# Patient Record
Sex: Male | Born: 1980 | Race: Black or African American | Hispanic: No | Marital: Married | Smoking: Never smoker
Health system: Southern US, Community
[De-identification: ages and names within clinical notes are randomized; demographics above are authoritative.]

## PROBLEM LIST (undated history)

## (undated) DIAGNOSIS — M5126 Other intervertebral disc displacement, lumbar region: Secondary | ICD-10-CM

## (undated) DIAGNOSIS — I10 Essential (primary) hypertension: Secondary | ICD-10-CM

## (undated) HISTORY — DX: Essential (primary) hypertension: I10

## (undated) HISTORY — PX: SHOULDER SURGERY: SHX246

## (undated) HISTORY — DX: Other intervertebral disc displacement, lumbar region: M51.26

## (undated) HISTORY — PX: ANKLE SURGERY: SHX546

---

## 2013-09-05 ENCOUNTER — Encounter (HOSPITAL_COMMUNITY): Payer: Self-pay | Admitting: Emergency Medicine

## 2013-09-05 ENCOUNTER — Emergency Department (HOSPITAL_COMMUNITY)
Admission: EM | Admit: 2013-09-05 | Discharge: 2013-09-05 | Discharge status: Against Medical Advice | Payer: BC Managed Care – PPO | Attending: Emergency Medicine | Admitting: Emergency Medicine

## 2013-09-05 DIAGNOSIS — M549 Dorsalgia, unspecified: Secondary | ICD-10-CM | POA: Diagnosis present

## 2013-09-05 NOTE — ED Notes (Signed)
Pt states his wife got him an appointment with PCP at 2pm. Pt left without being seen.

## 2013-09-05 NOTE — ED Notes (Addendum)
Hurt back 6 weeks ago- at gym, has been seeing chiropractor but pain got bad today; denies incontinence

## 2013-09-09 ENCOUNTER — Other Ambulatory Visit: Payer: Self-pay | Admitting: Sports Medicine

## 2013-09-09 DIAGNOSIS — M48061 Spinal stenosis, lumbar region without neurogenic claudication: Secondary | ICD-10-CM

## 2013-09-10 ENCOUNTER — Ambulatory Visit
Admission: RE | Admit: 2013-09-10 | Discharge: 2013-09-10 | Disposition: A | Discharge status: Home / Self Care | Payer: PRIVATE HEALTH INSURANCE | Source: Ambulatory Visit | Attending: Sports Medicine | Admitting: Sports Medicine

## 2013-09-10 VITALS — BP 159/96 | HR 86

## 2013-09-10 DIAGNOSIS — M48061 Spinal stenosis, lumbar region without neurogenic claudication: Secondary | ICD-10-CM

## 2013-09-10 MED ORDER — IOHEXOL 180 MG/ML  SOLN
1.0000 mL | Freq: Once | INTRAMUSCULAR | Status: AC | PRN
  Administered 2013-09-10: 1 mL via EPIDURAL

## 2013-09-10 MED ORDER — METHYLPREDNISOLONE ACETATE 40 MG/ML INJ SUSP (RADIOLOG
120.0000 mg | Freq: Once | INTRAMUSCULAR | Status: AC
  Administered 2013-09-10: 120 mg via EPIDURAL

## 2013-09-10 NOTE — Discharge Instructions (Signed)

## 2013-09-16 ENCOUNTER — Other Ambulatory Visit: Payer: Self-pay | Admitting: Sports Medicine

## 2013-09-16 DIAGNOSIS — M48061 Spinal stenosis, lumbar region without neurogenic claudication: Secondary | ICD-10-CM

## 2013-09-24 ENCOUNTER — Ambulatory Visit
Admission: RE | Admit: 2013-09-24 | Discharge: 2013-09-24 | Disposition: A | Discharge status: Home / Self Care | Payer: PRIVATE HEALTH INSURANCE | Source: Ambulatory Visit | Attending: Sports Medicine | Admitting: Sports Medicine

## 2013-09-24 VITALS — BP 176/108 | HR 91

## 2013-09-24 DIAGNOSIS — M48061 Spinal stenosis, lumbar region without neurogenic claudication: Secondary | ICD-10-CM

## 2013-09-24 MED ORDER — METHYLPREDNISOLONE ACETATE 40 MG/ML INJ SUSP (RADIOLOG
120.0000 mg | Freq: Once | INTRAMUSCULAR | Status: AC
  Administered 2013-09-24: 120 mg via EPIDURAL

## 2013-09-24 MED ORDER — IOHEXOL 180 MG/ML  SOLN
1.0000 mL | Freq: Once | INTRAMUSCULAR | Status: AC | PRN
  Administered 2013-09-24: 1 mL via EPIDURAL

## 2013-10-03 ENCOUNTER — Other Ambulatory Visit: Payer: Self-pay | Admitting: Sports Medicine

## 2013-10-03 DIAGNOSIS — M48061 Spinal stenosis, lumbar region without neurogenic claudication: Secondary | ICD-10-CM

## 2013-10-08 ENCOUNTER — Ambulatory Visit
Admission: RE | Admit: 2013-10-08 | Discharge: 2013-10-08 | Disposition: A | Discharge status: Home / Self Care | Payer: PRIVATE HEALTH INSURANCE | Source: Ambulatory Visit | Attending: Sports Medicine | Admitting: Sports Medicine

## 2013-10-08 DIAGNOSIS — M48061 Spinal stenosis, lumbar region without neurogenic claudication: Secondary | ICD-10-CM

## 2013-10-08 MED ORDER — IOHEXOL 180 MG/ML  SOLN
1.0000 mL | Freq: Once | INTRAMUSCULAR | Status: AC | PRN
  Administered 2013-10-08: 1 mL via EPIDURAL

## 2013-10-08 MED ORDER — METHYLPREDNISOLONE ACETATE 40 MG/ML INJ SUSP (RADIOLOG
120.0000 mg | Freq: Once | INTRAMUSCULAR | Status: AC
  Administered 2013-10-08: 120 mg via EPIDURAL

## 2019-02-14 ENCOUNTER — Encounter: Payer: Self-pay | Admitting: Gastroenterology

## 2019-03-14 ENCOUNTER — Ambulatory Visit: Payer: PRIVATE HEALTH INSURANCE | Admitting: Gastroenterology

## 2019-05-06 ENCOUNTER — Encounter: Payer: Self-pay | Admitting: Family Medicine

## 2020-08-05 ENCOUNTER — Ambulatory Visit: Payer: BC Managed Care – PPO | Admitting: Podiatry

## 2020-08-05 ENCOUNTER — Other Ambulatory Visit: Payer: Self-pay

## 2020-08-05 DIAGNOSIS — M79674 Pain in right toe(s): Secondary | ICD-10-CM

## 2020-08-05 DIAGNOSIS — B353 Tinea pedis: Secondary | ICD-10-CM | POA: Diagnosis not present

## 2020-08-05 DIAGNOSIS — B351 Tinea unguium: Secondary | ICD-10-CM | POA: Diagnosis not present

## 2020-08-05 DIAGNOSIS — M79675 Pain in left toe(s): Secondary | ICD-10-CM | POA: Diagnosis not present

## 2020-08-05 MED ORDER — CLOTRIMAZOLE-BETAMETHASONE 1-0.05 % EX CREA: 1.0000 "application " | TOPICAL_CREAM | Freq: Two times a day (BID) | CUTANEOUS | 3 refills | Status: DC

## 2020-08-05 MED ORDER — TERBINAFINE HCL 250 MG PO TABS: 250.0000 mg | ORAL_TABLET | Freq: Every day | ORAL | 0 refills | Status: DC

## 2020-08-05 NOTE — Progress Notes (Signed)
   Subjective: 40 y.o. male presenting today as a new patient for evaluation of thick discolored elongated nails noted 1-5 bilateral.  He also experiences itching skin with wetness in between his toes.  This is been chronic and ongoing for several years.  He does work for [employer] and still [employer].  He presents for further treatment and evaluation  Past Medical History:  Diagnosis Date   Hypertension    Lumbar herniated disc    L4-L5    Objective: Physical Exam General: The patient is alert and oriented x3 in no acute distress.  Dermatology: Hyperkeratotic, discolored, thickened, onychodystrophy noted 1-5 bilateral. Skin is warm, dry and supple bilateral lower extremities with exception of maceration noted between the digits 1-5 bilateral.  Vascular: Palpable pedal pulses bilaterally. No edema or erythema noted. Capillary refill within normal limits.  Neurological: Epicritic and protective threshold grossly intact bilaterally.   Musculoskeletal Exam: Range of motion within normal limits to all pedal and ankle joints bilateral. Muscle strength 5/5 in all groups bilateral.   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 #2  Today we discussed different treatment options regarding the onychomycosis of the toenails including oral, topical, and laser antifungal treatment modalities.  The patient opts for oral antifungal treatment modalities.  He is otherwise healthy and denies any liver pathology or symptoms #3 prescription for Lamisil 2 and 50 mg #90 daily #4 prescription for Lotrisone cream apply daily with 3 refills #5 return to clinic as needed  *Works for [employer]   Felecia Shelling, DPM [facility]  Dr. Felecia Shelling, DPM    [address] [place], [place] [address]                Office [phone]  Fax [phone]

## 2020-11-05 ENCOUNTER — Encounter: Payer: Self-pay | Admitting: Internal Medicine

## 2020-11-30 ENCOUNTER — Telehealth: Payer: Self-pay

## 2020-11-30 NOTE — Progress Notes (Signed)
Chief Complaint: Diarrhea  HPI : 40 year old male with history of hypertension presents with diarrhea.  He presents today accompanied by his wife.  He states that he has been having issues with intermittent diarrhea for years, but that his most recent episode of diarrhea has been going on for over a month, which is more significant than usual.  He has usually 4-5 bowel movements per day.  Usually 2 bowel movements in the morning, and additional BMs later in the day after he eats.  States that his stools will sometimes float.  Occasionally will have nocturnal stools.  Denies melena or hematochezia.  He has tried to cut out several foods from his diet to see if any of them are contributing to his diarrhea.  Cutting out dairy or gluten have not been helpful to him.  Denies weight loss.  Endorses some nausea.  Denies vomiting.  Also has had some issues with bloating.  He drinks mostly bottled water, only occasionally drinking tap water.  Typically he drinks 4-5 beers over the weekend, but denies alcohol use during the rest of the week.  Has stopped using caffeine.  He did try probiotics but this appeared to make his diarrhea worse.  Denies prior abdominal surgeries.  Denies abdominal pain.  Denies recent sick contacts.  Denies prior issues with pancreatitis.  He works as an Fish farm manager.   Past Medical History:  Diagnosis Date   Hypertension    Lumbar herniated disc    L4-L5     Past Surgical History:  Procedure Laterality Date   ANKLE SURGERY     SHOULDER SURGERY     Family History  Problem Relation Age of Onset   Hypertension Mother    Hypertension Father    Stroke Sister    Social History   Tobacco Use   Smoking status: Never   Smokeless tobacco: Never  Substance Use Topics   Alcohol use: No   Drug use: No   Current Outpatient Medications  Medication Sig Dispense Refill   metoprolol tartrate (LOPRESSOR) 50 MG tablet Take 50 mg by mouth 2 (two) times daily.     No  current facility-administered medications for this visit.   No Known Allergies   Review of Systems: All systems reviewed and negative except where noted in HPI.   Physical Exam: BP 124/76   Pulse 70   Ht 5\' 10"  (1.778 m)   Wt 268 lb (121.6 kg)   BMI 38.45 kg/m  Constitutional: Pleasant,well-developed, male in no acute distress. HEENT: Normocephalic and atraumatic. Conjunctivae are normal. No scleral icterus. Cardiovascular: Normal rate, regular rhythm.  Pulmonary/chest: Effort normal and breath sounds normal. No wheezing, rales or rhonchi. Abdominal: Soft, nondistended, nontender. Bowel sounds active throughout. There are no masses palpable. No hepatomegaly. Extremities: No edema Neurological: Alert and oriented to person place and time. Skin: Skin is warm and dry. No rashes noted. Psychiatric: Normal mood and affect. Behavior is normal.  His last CMP from EMR was in 2014  ASSESSMENT AND PLAN: Diarrhea Patient presents with diarrhea that has been intermittent in nature.  Patient does have any routine labs in our system so we will check some routine labs to ensure proper blood counts, electrolytes, and albumin.  We will also assess for thyroid disease, static disease, and signs of inflammation.  Also rule out infectious etiologies of his diarrhea. - Encouraged food diary, which patient has already been trying - Check CBC, CMP, TSH, celiac panel, CRP - Check C dif, GI pathogen  panel, O&P, fecal elastase. If negative, then can start Imodium therapy - Consider RUQ U/S in the future - RTC in 2 months  Eulah Pont, MD

## 2020-11-30 NOTE — Telephone Encounter (Signed)
Called and left message for patient that we need to move his appointment up a little to 9:00 am instead of 9:10 am. Asked that he come in a little before 9 and bring his new patient paperwork.  Called and spoke to pts wife.  She confirmed he will come at 25.

## 2020-12-01 ENCOUNTER — Ambulatory Visit (INDEPENDENT_AMBULATORY_CARE_PROVIDER_SITE_OTHER): Payer: BC Managed Care – PPO | Admitting: Internal Medicine

## 2020-12-01 ENCOUNTER — Other Ambulatory Visit (INDEPENDENT_AMBULATORY_CARE_PROVIDER_SITE_OTHER): Payer: BC Managed Care – PPO

## 2020-12-01 ENCOUNTER — Encounter: Payer: Self-pay | Admitting: Internal Medicine

## 2020-12-01 VITALS — BP 124/76 | HR 70 | Ht 70.0 in | Wt 268.0 lb

## 2020-12-01 DIAGNOSIS — R197 Diarrhea, unspecified: Secondary | ICD-10-CM

## 2020-12-01 DIAGNOSIS — R140 Abdominal distension (gaseous): Secondary | ICD-10-CM

## 2020-12-01 LAB — CBC WITH DIFFERENTIAL/PLATELET
Basophils Absolute: 0 10*3/uL (ref 0.0–0.1)
Basophils Relative: 0.7 % (ref 0.0–3.0)
Eosinophils Absolute: 0.1 10*3/uL (ref 0.0–0.7)
Eosinophils Relative: 1.8 % (ref 0.0–5.0)
HCT: 43 % (ref 39.0–52.0)
Hemoglobin: 14.1 g/dL (ref 13.0–17.0)
Lymphocytes Relative: 45 % (ref 12.0–46.0)
Lymphs Abs: 1.9 10*3/uL (ref 0.7–4.0)
MCHC: 32.9 g/dL (ref 30.0–36.0)
MCV: 88 fl (ref 78.0–100.0)
Monocytes Absolute: 0.5 10*3/uL (ref 0.1–1.0)
Monocytes Relative: 11.1 % (ref 3.0–12.0)
Neutro Abs: 1.8 10*3/uL (ref 1.4–7.7)
Neutrophils Relative %: 41.4 % — ABNORMAL LOW (ref 43.0–77.0)
Platelets: 177 10*3/uL (ref 150.0–400.0)
RBC: 4.89 Mil/uL (ref 4.22–5.81)
RDW: 13.9 % (ref 11.5–15.5)
WBC: 4.2 10*3/uL (ref 4.0–10.5)

## 2020-12-01 LAB — COMPREHENSIVE METABOLIC PANEL
ALT: 10 U/L (ref 0–53)
AST: 19 U/L (ref 0–37)
Albumin: 4.3 g/dL (ref 3.5–5.2)
Alkaline Phosphatase: 35 U/L — ABNORMAL LOW (ref 39–117)
BUN: 19 mg/dL (ref 6–23)
CO2: 27 mEq/L (ref 19–32)
Calcium: 9.3 mg/dL (ref 8.4–10.5)
Chloride: 104 mEq/L (ref 96–112)
Creatinine, Ser: 1.13 mg/dL (ref 0.40–1.50)
GFR: 81.38 mL/min (ref >60.00)
Glucose, Bld: 84 mg/dL (ref 70–99)
Potassium: 3.8 mEq/L (ref 3.5–5.1)
Sodium: 139 mEq/L (ref 135–145)
Total Bilirubin: 0.6 mg/dL (ref 0.2–1.2)
Total Protein: 7.2 g/dL (ref 6.0–8.3)

## 2020-12-01 LAB — TSH: TSH: 0.87 u[IU]/mL (ref 0.35–5.50)

## 2020-12-01 LAB — C-REACTIVE PROTEIN: CRP: <1 mg/dL (ref 0.5–20.0)

## 2020-12-01 NOTE — Patient Instructions (Addendum)
If you are age 40 or older, your body mass index should be between 23-30. Your Body mass index is 38.45 kg/m. If this is out of the aforementioned range listed, please consider follow up with your Primary Care Provider.  If you are age 55 or younger, your body mass index should be between 19-25. Your Body mass index is 38.45 kg/m. If this is out of the aformentioned range listed, please consider follow up with your Primary Care Provider.   ________________________________________________________  The [facility] providers would like to encourage you to use [facility] to communicate with providers for non-urgent requests or questions.  Due to long hold times on the telephone, sending your provider a message by [facility] may be a faster and more efficient way to get a response.  Please allow 48 business hours for a response.  Please remember that this is for non-urgent requests.  _______________________________________________________  Please go to the lab in the basement of our building to have lab work done as you leave today. Hit "B" for basement when you get on the elevator.  When the doors open the lab is on your left.  We will call you with the results. Thank you.  You have been scheduled for a follow up appointment on Monday, 1-19 at 8:50 am.  Thank you for entrusting me with your care and for choosing [facility], Dr. Eulah Pont

## 2020-12-02 LAB — TISSUE TRANSGLUTAMINASE, IGA: (tTG) Ab, IgA: <1 U/mL

## 2020-12-02 LAB — IGA: Immunoglobulin A: 138 mg/dL (ref 47–310)

## 2020-12-04 LAB — GI PROFILE, STOOL, PCR

## 2020-12-11 LAB — OVA AND PARASITE EXAMINATION
CONCENTRATE RESULT:: NONE SEEN
MICRO NUMBER:: 12608883
SPECIMEN QUALITY:: ADEQUATE
TRICHROME RESULT:: NONE SEEN

## 2020-12-11 LAB — PANCREATIC ELASTASE, FECAL: Pancreatic Elastase-1, Stool: >500 mcg/g

## 2021-02-01 ENCOUNTER — Ambulatory Visit: Payer: BC Managed Care – PPO | Admitting: Internal Medicine

## 2021-05-19 ENCOUNTER — Ambulatory Visit: Payer: BC Managed Care – PPO | Admitting: Podiatry

## 2021-05-19 DIAGNOSIS — L600 Ingrowing nail: Secondary | ICD-10-CM

## 2021-05-19 DIAGNOSIS — R52 Pain, unspecified: Secondary | ICD-10-CM

## 2021-05-19 NOTE — Patient Instructions (Signed)

## 2021-05-31 NOTE — Progress Notes (Signed)
? ?  Subjective: ?Patient presents today for evaluation of pain to the medial border left great toe. Patient is concerned for possible ingrown nail.  It is very sensitive to touch.  Patient presents today for further treatment and evaluation. ? ?Past Medical History:  ?Diagnosis Date  ? Hypertension   ? Lumbar herniated disc   ? L4-L5  ? ? ?Objective:  ?General: Well developed, nourished, in no acute distress, alert and oriented x3  ? ?Dermatology: Skin is warm, dry and supple bilateral.  Medial border left great toe appears to be erythematous with evidence of an ingrowing nail. Pain on palpation noted to the border of the nail fold. The remaining nails appear unremarkable at this time. There are no open sores, lesions. ? ?Vascular: Dorsalis Pedis artery and Posterior Tibial artery pedal pulses palpable. No lower extremity edema noted.  ? ?Neruologic: Grossly intact via light touch bilateral. ? ?Musculoskeletal: Muscular strength within normal limits in all groups bilateral. Normal range of motion noted to all pedal and ankle joints.  ? ?Assesement: ?#1 Paronychia with ingrowing nail medial border left great toe ?#2 Pain in toe ? ?Plan of Care:  ?1. Patient evaluated.  ?2. Discussed treatment alternatives and plan of care. Explained nail avulsion procedure and post procedure course to patient. ?3. Patient opted for permanent partial nail avulsion of the ingrown portion of the nail.  ?4. Prior to procedure, local anesthesia infiltration utilized using 3 ml of a 50:50 mixture of 2% plain lidocaine and 0.5% plain marcaine in a normal hallux block fashion and a betadine prep performed.  ?5. Partial permanent nail avulsion with chemical matrixectomy performed using [id number] applications of phenol followed by alcohol flush.  ?6. Light dressing applied.  Post care instructions provided ?7.  Silvadene cream provided to apply daily ?8.  Return to clinic 2 weeks. ? ?Edrick Kins, DPM ?[name] ? ?Dr. Edrick Kins, DPM  ?  ?[address]                                       ?[place], [place] [address]                ?Office [phone]  ?Fax [phone] ? ? ? ? ?

## 2021-06-02 ENCOUNTER — Ambulatory Visit: Payer: BC Managed Care – PPO | Admitting: Podiatry
